# Patient Record
Sex: Male | Born: 1995 | Hispanic: Yes | Marital: Single | Smoking: Current every day smoker
Health system: Southern US, Community
[De-identification: ages and names within clinical notes are randomized; demographics above are authoritative.]

---
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
## 2016-10-20 ENCOUNTER — Emergency Department
Admission: EM | Admit: 2016-10-20 | Discharge: 2016-10-21 | Disposition: A | Discharge status: Home / Self Care | Payer: Self-pay | Attending: Emergency Medicine | Admitting: Emergency Medicine

## 2016-10-20 ENCOUNTER — Emergency Department: Payer: Self-pay

## 2016-10-20 ENCOUNTER — Encounter: Payer: Self-pay | Admitting: Emergency Medicine

## 2016-10-20 DIAGNOSIS — W228XXA Striking against or struck by other objects, initial encounter: Secondary | ICD-10-CM | POA: Insufficient documentation

## 2016-10-20 DIAGNOSIS — F17210 Nicotine dependence, cigarettes, uncomplicated: Secondary | ICD-10-CM | POA: Insufficient documentation

## 2016-10-20 DIAGNOSIS — Y999 Unspecified external cause status: Secondary | ICD-10-CM | POA: Insufficient documentation

## 2016-10-20 DIAGNOSIS — Y929 Unspecified place or not applicable: Secondary | ICD-10-CM | POA: Insufficient documentation

## 2016-10-20 DIAGNOSIS — S060X0A Concussion without loss of consciousness, initial encounter: Secondary | ICD-10-CM | POA: Insufficient documentation

## 2016-10-20 DIAGNOSIS — S0083XA Contusion of other part of head, initial encounter: Secondary | ICD-10-CM | POA: Insufficient documentation

## 2016-10-20 DIAGNOSIS — S0990XA Unspecified injury of head, initial encounter: Secondary | ICD-10-CM

## 2016-10-20 DIAGNOSIS — Y939 Activity, unspecified: Secondary | ICD-10-CM | POA: Insufficient documentation

## 2016-10-20 DIAGNOSIS — T148XXA Other injury of unspecified body region, initial encounter: Secondary | ICD-10-CM

## 2016-10-20 LAB — URINALYSIS, COMPLETE (UACMP) WITH MICROSCOPIC
Bacteria, UA: NONE SEEN
Bilirubin Urine: NEGATIVE
Glucose, UA: NEGATIVE mg/dL
Hgb urine dipstick: NEGATIVE
Ketones, ur: NEGATIVE mg/dL
Leukocytes, UA: NEGATIVE
Nitrite: NEGATIVE
PH: 6 (ref 5.0–8.0)
Protein, ur: NEGATIVE mg/dL
SPECIFIC GRAVITY, URINE: 1.021 (ref 1.005–1.030)
SQUAMOUS EPITHELIAL / LPF: NONE SEEN

## 2016-10-20 LAB — BASIC METABOLIC PANEL
Anion gap: 7 (ref 5–15)
BUN: 13 mg/dL (ref 6–20)
CHLORIDE: 105 mmol/L (ref 101–111)
CO2: 28 mmol/L (ref 22–32)
CREATININE: 0.94 mg/dL (ref 0.61–1.24)
Calcium: 9.5 mg/dL (ref 8.9–10.3)
GFR calc Af Amer: >60 mL/min (ref >60)
GFR calc non Af Amer: >60 mL/min (ref >60)
GLUCOSE: 98 mg/dL (ref 65–99)
Potassium: 3.9 mmol/L (ref 3.5–5.1)
SODIUM: 140 mmol/L (ref 135–145)

## 2016-10-20 LAB — CBC
HCT: 43.9 % (ref 40.0–52.0)
Hemoglobin: 14.9 g/dL (ref 13.0–18.0)
MCH: 27.4 pg (ref 26.0–34.0)
MCHC: 34 g/dL (ref 32.0–36.0)
MCV: 80.7 fL (ref 80.0–100.0)
PLATELETS: 276 10*3/uL (ref 150–440)
RBC: 5.44 MIL/uL (ref 4.40–5.90)
RDW: 14.5 % (ref 11.5–14.5)
WBC: 10 10*3/uL (ref 3.8–10.6)

## 2016-10-20 LAB — URINE DRUG SCREEN, QUALITATIVE (ARMC ONLY)
AMPHETAMINES, UR SCREEN: NOT DETECTED
Barbiturates, Ur Screen: NOT DETECTED
Benzodiazepine, Ur Scrn: NOT DETECTED
COCAINE METABOLITE, UR ~~LOC~~: NOT DETECTED
Cannabinoid 50 Ng, Ur ~~LOC~~: NOT DETECTED
MDMA (Ecstasy)Ur Screen: NOT DETECTED
METHADONE SCREEN, URINE: NOT DETECTED
OPIATE, UR SCREEN: NOT DETECTED
Phencyclidine (PCP) Ur S: NOT DETECTED
TRICYCLIC, UR SCREEN: NOT DETECTED

## 2016-10-20 LAB — TROPONIN I: Troponin I: <0.03 ng/mL (ref <0.03)
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
## 2016-10-21 MED ORDER — ONDANSETRON 4 MG PO TBDP: 4.0000 mg | ORAL_TABLET | Freq: Three times a day (TID) | ORAL | 0 refills | Status: DC | PRN
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
## 2016-10-21 NOTE — Discharge Instructions (Signed)
1. Apply a thin layer of Neosporin to your facial abrasions twice daily 5 days. 2. Drink plenty of fluids daily. 3. Return to the ER for worsening symptoms, persistent vomiting, difficulty breathing or other concerns.

## 2018-08-16 IMAGING — CT CT HEAD W/O CM
3 of 7 series · 15 of 47 positions shown, 18 images · non-contrast
Comparison: None.

CLINICAL DATA: 20 y/o M; status post fall with injury to left-sided
face and head.

EXAM:
CT HEAD WITHOUT CONTRAST
CT MAXILLOFACIAL WITHOUT CONTRAST
TECHNIQUE: Multidetector CT imaging of the head and maxillofacial structures
were performed using the standard protocol without intravenous
contrast. Multiplanar CT image reconstructions of the maxillofacial
structures were also generated.

[Series 4: coronal soft tissue · coronal · 0.30mm/px · 3 of 70 slices shown]
[im 10/70  brain]
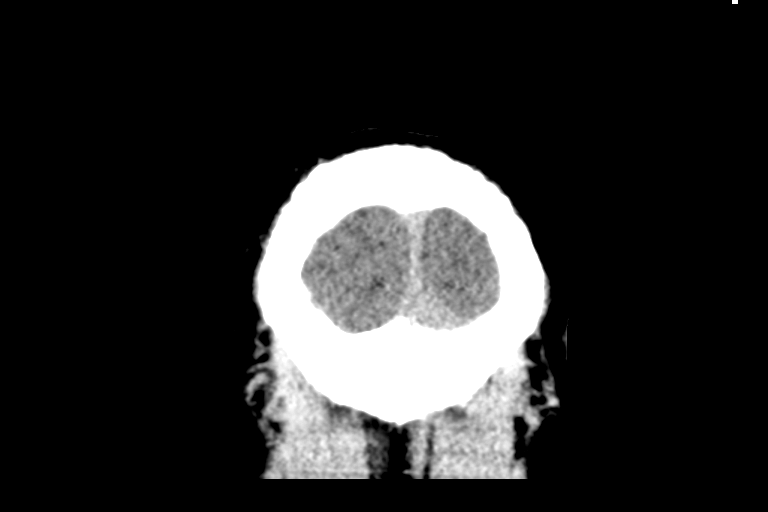
[im 20/70  brain]
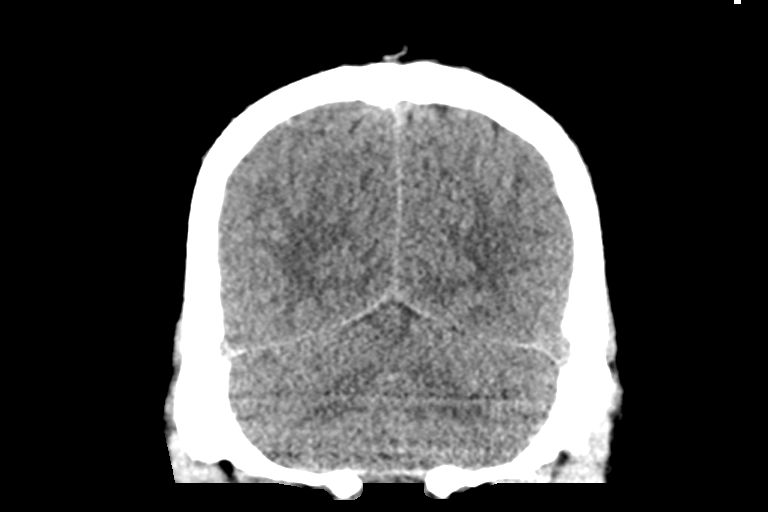
[im 30/70  brain]
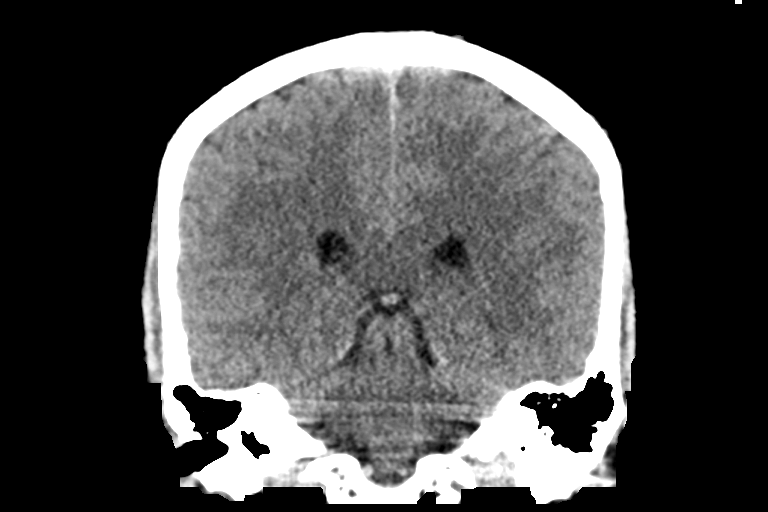

[Series 6: max soft · axial · 0.34mm/px · z∈[-266,-122]mm · 10 of 88 slices shown, 13 images]
[im 8/88  brain]
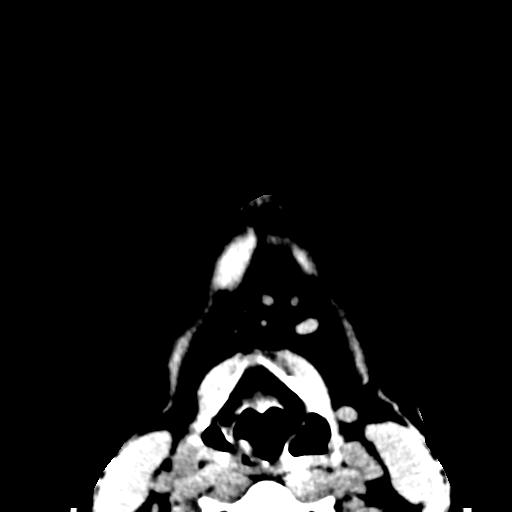
[im 8/88  bone]
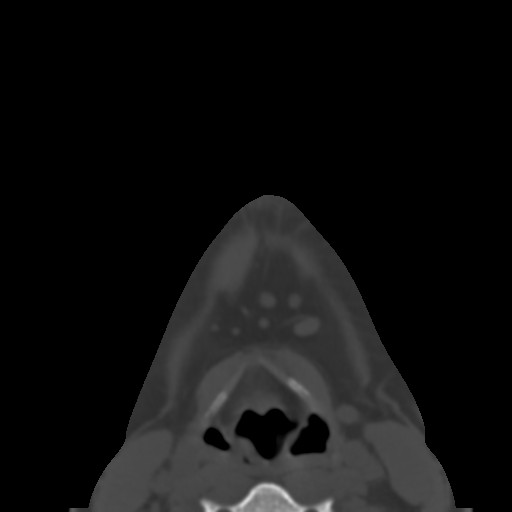
[im 15/88  brain]
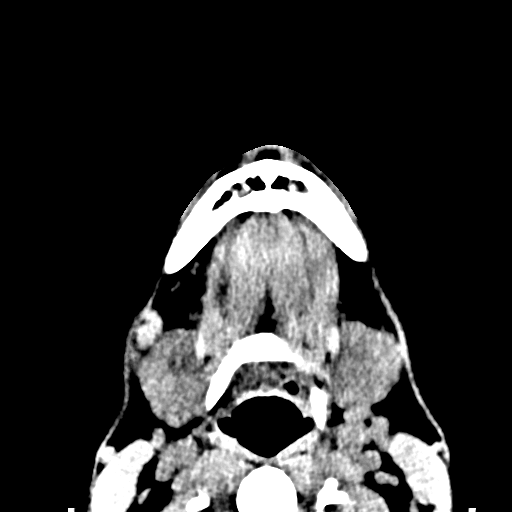
[im 22/88  brain]
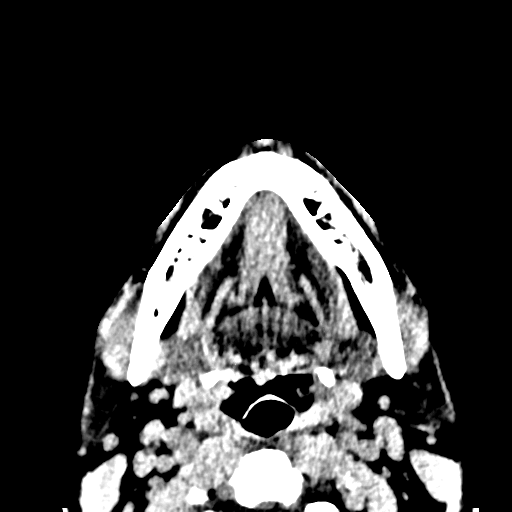
[im 30/88  brain]
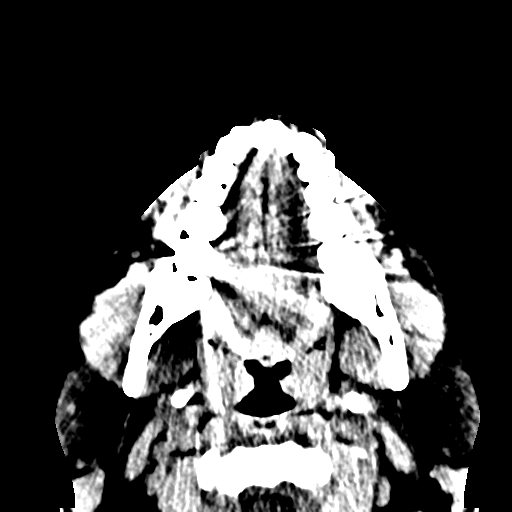
[im 37/88  brain]
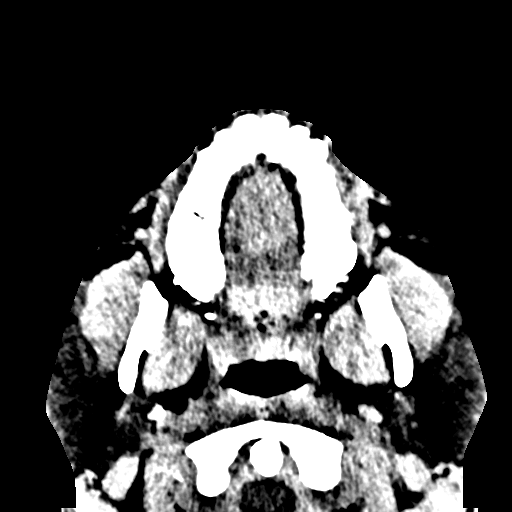
[im 37/88  bone]
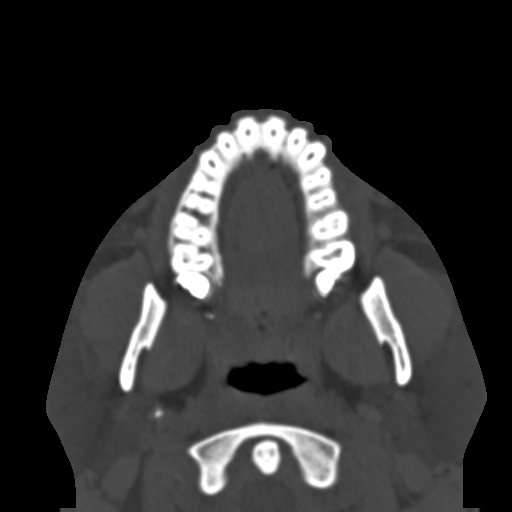
[im 51/88  brain]
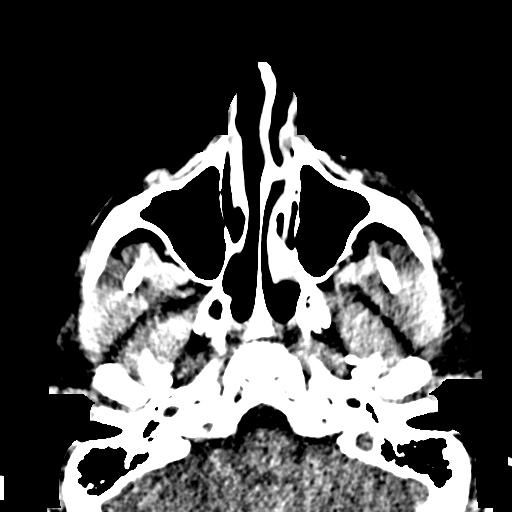
[im 59/88  brain]
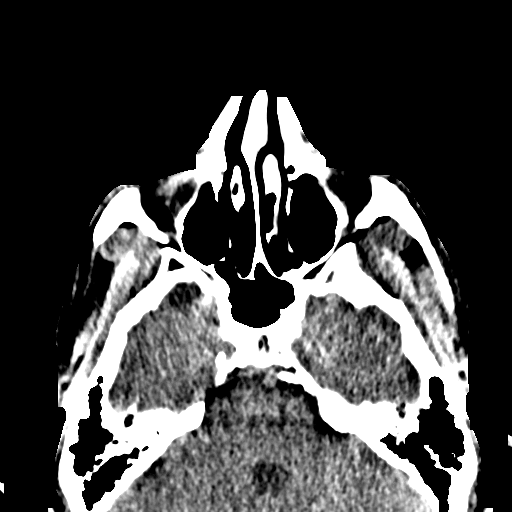
[im 66/88  brain]
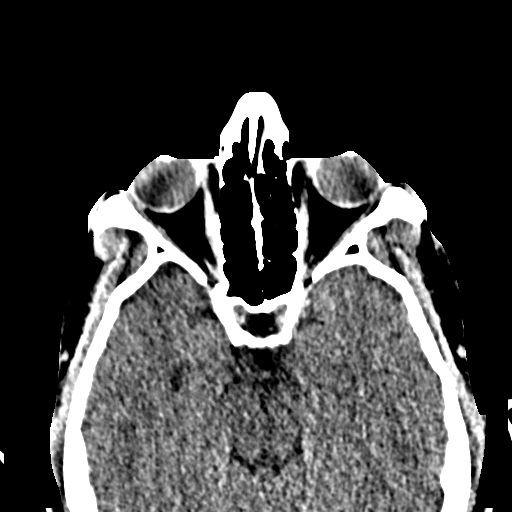
[im 73/88  brain]
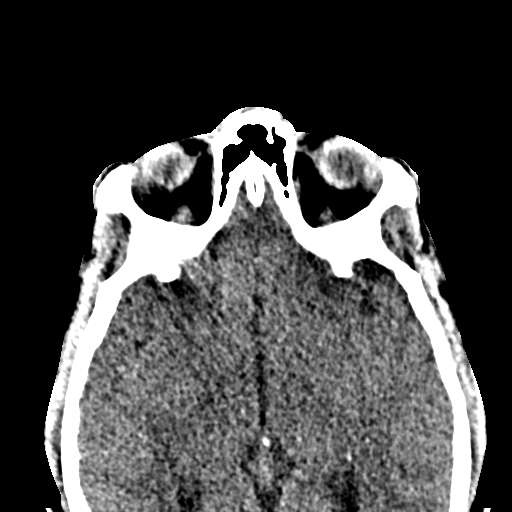
[im 73/88  bone]
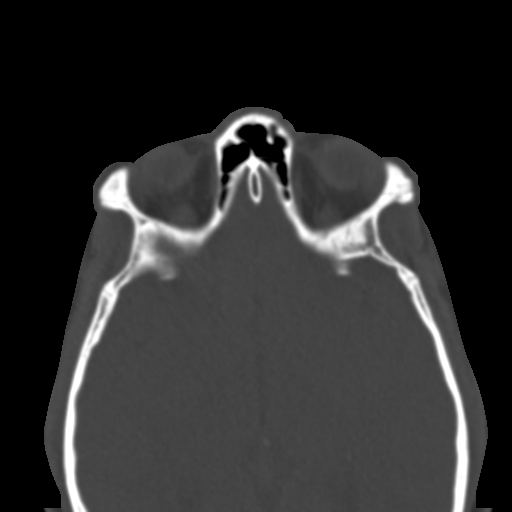
[im 80/88  brain]
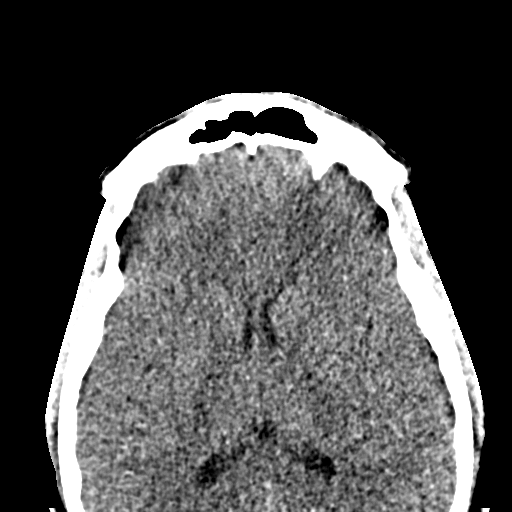

[Series 11: sagittal soft · sagittal · 0.35mm/px · 2 of 80 slices shown]
[im 27/80  brain]
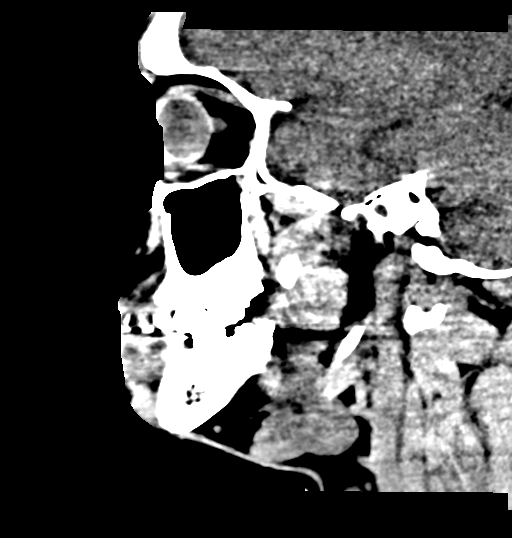
[im 53/80  brain]
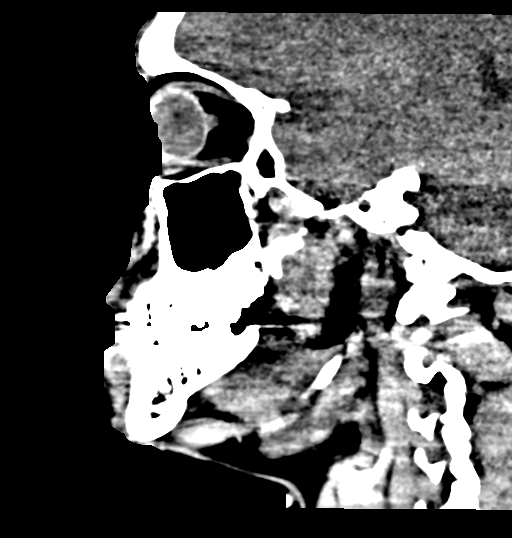

[15 of 47 positions shown; findings below may reference images not displayed]

FINDINGS: CT HEAD FINDINGS

Brain: No evidence of acute infarction, hemorrhage, hydrocephalus,
extra-axial collection or mass lesion/mass effect.

Vascular: No hyperdense vessel or unexpected calcification.

Skull: Normal. Negative for fracture or focal lesion.

Other: None.

CT MAXILLOFACIAL FINDINGS

Osseous: No fracture or mandibular dislocation. No destructive
process.

Orbits: Negative. No traumatic or inflammatory finding.

Sinuses: Clear.

Soft tissues: Negative.
IMPRESSION: 1. No acute intracranial abnormality or displaced calvarial
fracture. Unremarkable CT of the head.
2. No acute facial fracture or mandibular dislocation. Unremarkable
maxillofacial CT.

By: Auntyjatty Delowr M.D.

## 2018-09-28 ENCOUNTER — Encounter: Payer: Self-pay | Admitting: Medical Oncology

## 2018-09-28 ENCOUNTER — Other Ambulatory Visit: Payer: Self-pay

## 2018-09-28 ENCOUNTER — Emergency Department
Admission: EM | Admit: 2018-09-28 | Discharge: 2018-09-28 | Disposition: A | Discharge status: Home / Self Care | Payer: 59 | Attending: Emergency Medicine | Admitting: Emergency Medicine

## 2018-09-28 DIAGNOSIS — R21 Rash and other nonspecific skin eruption: Secondary | ICD-10-CM | POA: Diagnosis present

## 2018-09-28 DIAGNOSIS — F17210 Nicotine dependence, cigarettes, uncomplicated: Secondary | ICD-10-CM | POA: Diagnosis not present

## 2018-09-28 DIAGNOSIS — B360 Pityriasis versicolor: Secondary | ICD-10-CM | POA: Diagnosis not present

## 2018-09-28 MED ORDER — FLUCONAZOLE 150 MG PO TABS
150.0000 mg | ORAL_TABLET | ORAL | 0 refills | Status: AC
Start: 2018-09-28 — End: 2018-12-15

## 2018-09-28 MED ORDER — KETOCONAZOLE 2 % EX CREA: 1.0000 "application " | TOPICAL_CREAM | Freq: Every day | CUTANEOUS | 2 refills | Status: AC

## 2018-09-28 NOTE — ED Notes (Signed)
See triage note  Presents with rash  States he has a rash to chest,arms and neck  States this has been going on for about 2 years   Was seen and placed on some medicated cream  But has not been seen by a dermatologist   States he feels like it is getting worse  Positive itching    Also having some intermittent right hip pain  States pain comes and goes  But now pain has changed in intensity  Ambulates well to treatment room

## 2018-09-28 NOTE — ED Provider Notes (Signed)
[facility] Emergency Department Provider Note  ____________________________________________  Time seen: Approximately 4:39 PM  I have reviewed the triage vital signs and the nursing notes.   HISTORY  Chief Complaint Rash    HPI Joel Moss is a 23 y.o. male who presents the emergency department complaining of a rash to the torso that has been present for approximately 2 years.  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  He was started on a topical antifungal but states that time the cost was prohibitive and he stopped the medication.  Patient reports that it is continued to spread, is very pruritic when he sweats.  Patient reports that he had an area develop in his groin which prompted him to present to the emergency department.  He denies any other complaints at this time.  Patient is requesting medication for his tinea infection.         History reviewed. No pertinent past medical history.  There are no active problems to display for this patient.   History reviewed. No pertinent surgical history.  Prior to Admission medications   Medication Sig Start Date End Date Taking? Authorizing Provider  fluconazole (DIFLUCAN) 150 MG tablet Take 1 tablet (150 mg total) by mouth once a week for 12 doses. 09/28/18 12/15/18  Robey Massmann, Delorise RoyalsJonathan D, PA-C  ketoconazole (NIZORAL) 2 % cream Apply 1 application topically daily for 28 days. 09/28/18 10/26/18  Rocklin Soderquist, Delorise RoyalsJonathan D, PA-C    Allergies Patient has no known allergies.  No family history on file.  Social History Social History   Tobacco Use  . Smoking status: Current Every Day Smoker    Types: Cigarettes  . Smokeless tobacco: Never Used  Substance Use Topics  . Alcohol use: Yes  . Drug use: No     Review of Systems  Constitutional: No fever/chills Eyes: No visual changes.  ENT: No upper respiratory complaints. Cardiovascular: no chest pain. Respiratory: no cough. No  SOB. Gastrointestinal: No abdominal pain.  No nausea, no vomiting.  No diarrhea.  No constipation. Musculoskeletal: Negative for musculoskeletal pain. Skin: Positive for rash to the torso Neurological: Negative for headaches, focal weakness or numbness. 10-point ROS otherwise negative.  ____________________________________________   PHYSICAL EXAM:  VITAL SIGNS: ED Triage Vitals  Enc Vitals Group     BP 09/28/18 1619 (!) 147/78     Pulse Rate 09/28/18 1619 89     Resp --      Temp 09/28/18 1619 98 F (36.7 C)     Temp Source 09/28/18 1619 Oral     SpO2 09/28/18 1619 98 %     Weight 09/28/18 1621 210 lb (95.3 kg)     Height 09/28/18 1621 5\' 10"  (1.778 m)     Head Circumference --      Peak Flow --      Pain Score 09/28/18 1621 0     Pain Loc --      Pain Edu? --      Excl. in GC? --      Constitutional: Alert and oriented. Well appearing and in no acute distress. Eyes: Conjunctivae are normal. PERRL. EOMI. Head: Atraumatic. Neck: No stridor.    Cardiovascular: Normal rate, regular rhythm. Normal S1 and S2.  Good peripheral circulation. Respiratory: Normal respiratory effort without tachypnea or retractions. Lungs CTAB. Good air entry to the bases with no decreased or absent breath sounds. Musculoskeletal: Full range of motion to all extremities. No gross deformities appreciated. Neurologic:  Normal speech and  language. No gross focal neurologic deficits are appreciated.  Skin:  Skin is warm, dry and intact.  Patient has a scattered patchy rash to the torso, bilateral upper arms.  These are slightly erythematous.  Nonraised.  Some are slightly scaly. Psychiatric: Mood and affect are normal. Speech and behavior are normal. Patient exhibits appropriate insight and judgement.   ____________________________________________   LABS (all labs ordered are listed, but only abnormal results are displayed)  Labs Reviewed - No data to  display ____________________________________________  EKG   ____________________________________________  RADIOLOGY   No results found.  ____________________________________________    PROCEDURES  Procedure(s) performed:    Procedures    Medications - No data to display   ____________________________________________   INITIAL IMPRESSION / ASSESSMENT AND PLAN / ED COURSE  Pertinent labs & imaging results that were available during my care of the patient were reviewed by me and considered in my medical decision making (see chart for details).  Review of the [place] CSRS was performed in accordance of the [facility] prior to dispensing any controlled drugs.           Patient's diagnosis is consistent with tinea versicolor.  Patient presents emergency department with complaint of 2-year history of rash.  Patient has a rash consistent with tinea versicolor.  Patient has started treatment 2 years ago but with treatment was cost prohibitive and he did not finish same.  No surrounding areas of cellulitis.  Patient reports prescribed Diflucan and ketoconazole topical.  Follow-up with dermatology as needed..  Patient is given ED precautions to return to the ED for any worsening or new symptoms.     ____________________________________________  FINAL CLINICAL IMPRESSION(S) / ED DIAGNOSES  Final diagnoses:  Tinea versicolor      NEW MEDICATIONS STARTED DURING THIS VISIT:  ED Discharge Orders         Ordered    fluconazole (DIFLUCAN) 150 MG tablet  Weekly     09/28/18 1709    ketoconazole (NIZORAL) 2 % cream  Daily     09/28/18 1709              This chart was dictated using voice recognition software/Dragon. Despite best efforts to proofread, errors can occur which can change the meaning. Any change was purely unintentional.    Racheal Patches, PA-C 09/28/18 1710    Jeanmarie Plant, MD 09/28/18 2014

## 2018-09-28 NOTE — ED Triage Notes (Signed)
Pt reports rash to chest and arms for about 2 years, pt reports rash is itchy when he gets hot.
